# Patient Record
Sex: Male | Born: 1960 | Race: White | Hispanic: No | Smoking: Never smoker
Health system: Southern US, Community
[De-identification: ages and names within clinical notes are randomized; demographics above are authoritative.]

## PROBLEM LIST (undated history)

## (undated) ENCOUNTER — Ambulatory Visit: Admission: EM | Discharge status: Against Medical Advice | Payer: 59

## (undated) HISTORY — PX: HERNIA REPAIR: SHX51

---

## 2010-05-29 ENCOUNTER — Ambulatory Visit: Payer: Self-pay | Admitting: Surgery

## 2011-04-02 ENCOUNTER — Ambulatory Visit: Payer: Self-pay | Admitting: Urology

## 2011-04-11 ENCOUNTER — Ambulatory Visit: Payer: Self-pay | Admitting: Urology

## 2013-10-01 IMAGING — CR DG IVP HYPERTENSIVE
1 series · 8 of 10 positions shown · non-contrast
Comparison: none

REASON FOR EXAM: UTI hematuria
COMMENTS:

[Series 1: scout · 0.17mm/px · 8 of 11 slices shown]
[im 1/11]
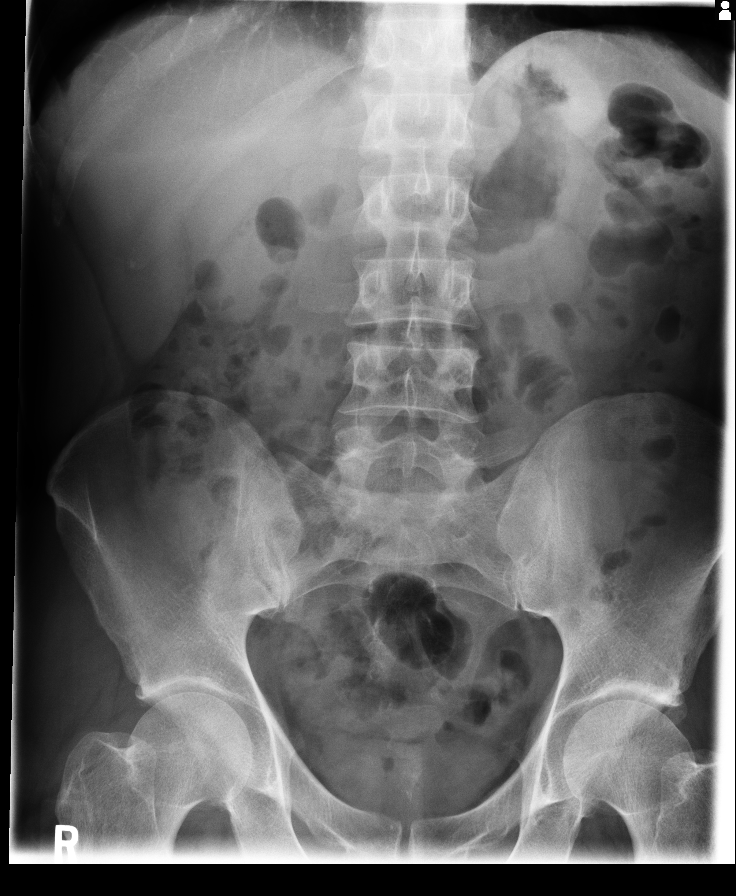
[im 2/11]
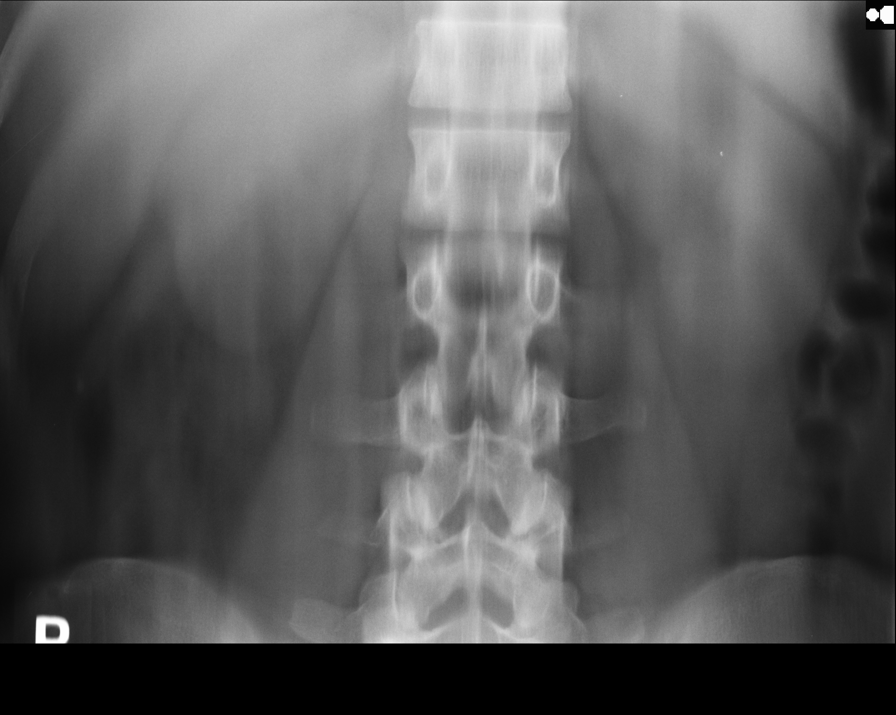
[im 3/11]
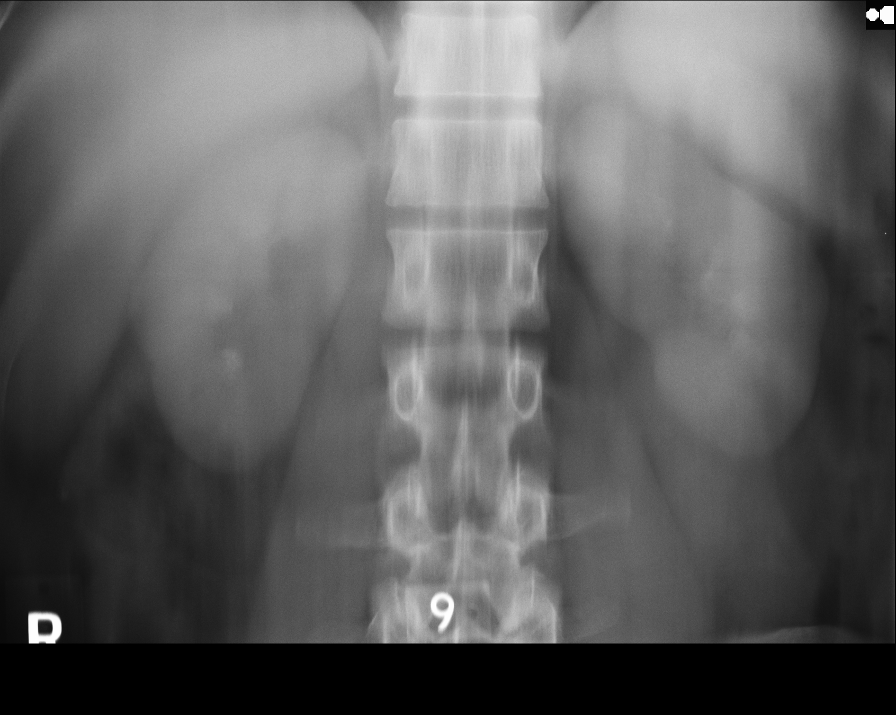
[im 4/11]
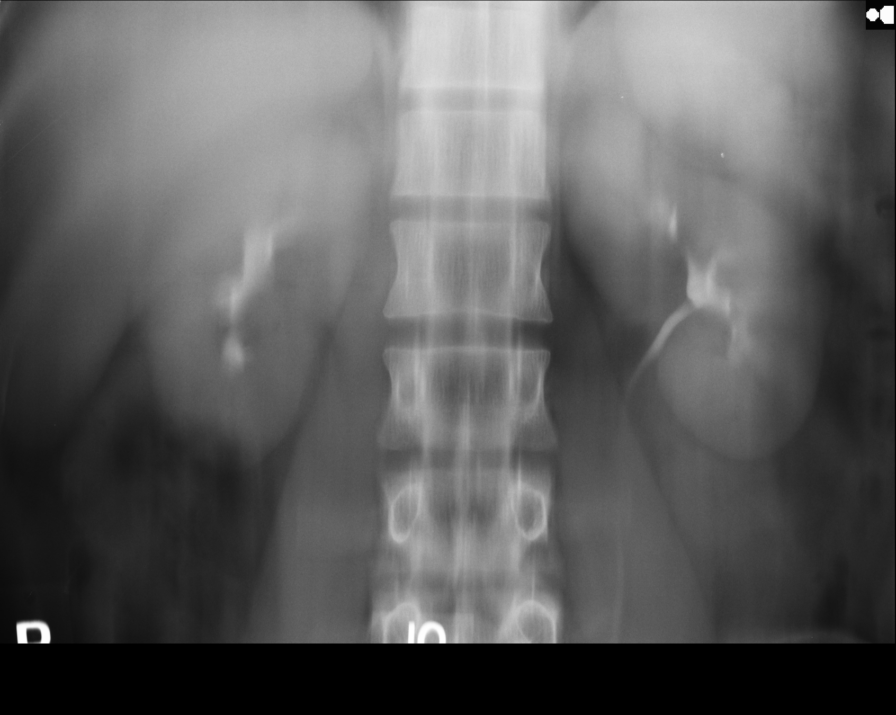
[im 5/11]
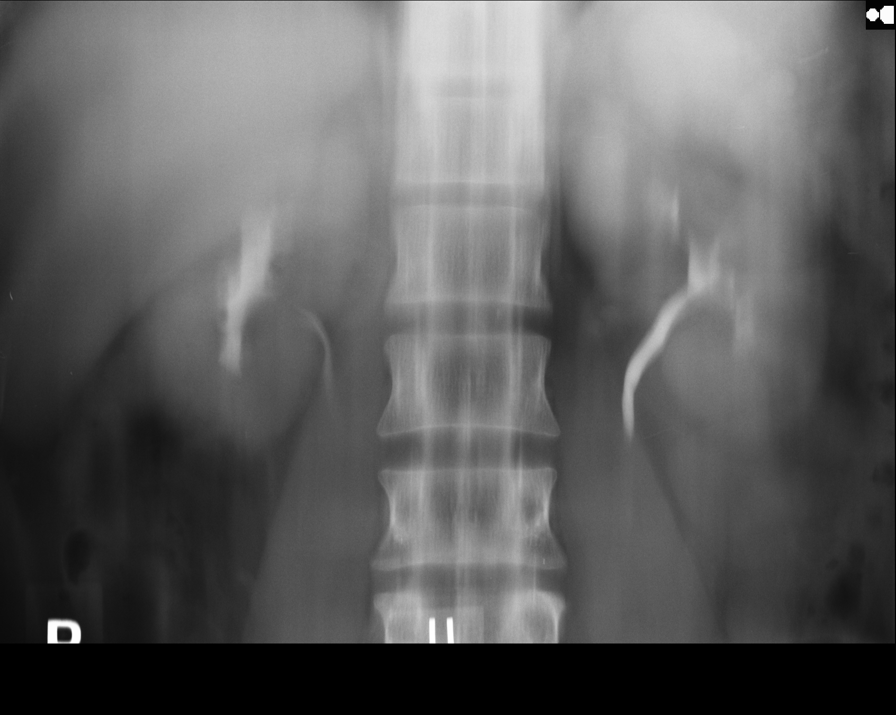
[im 6/11]
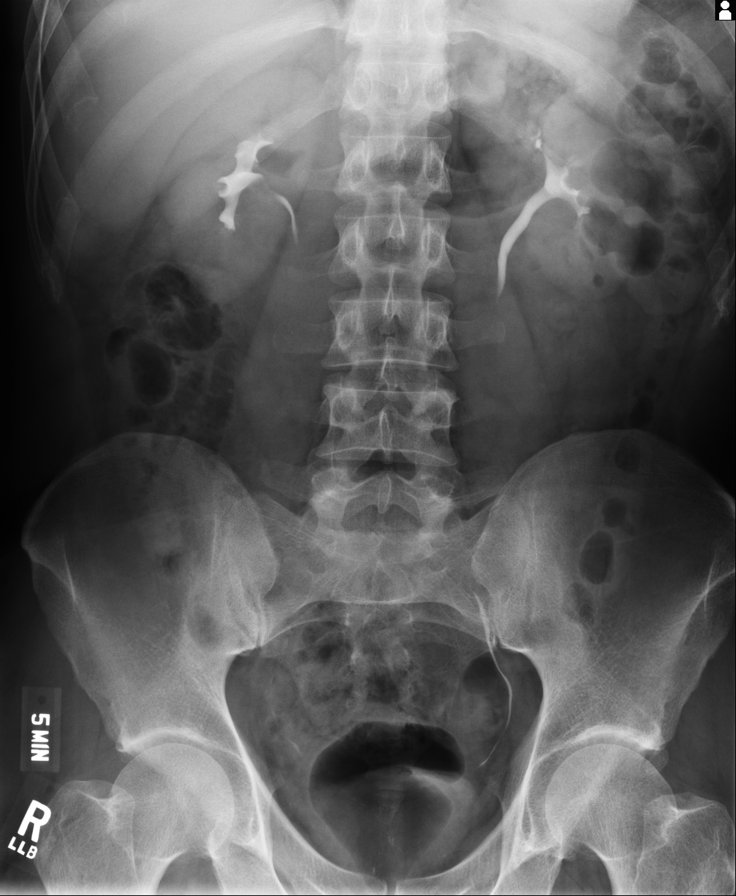
[im 7/11]
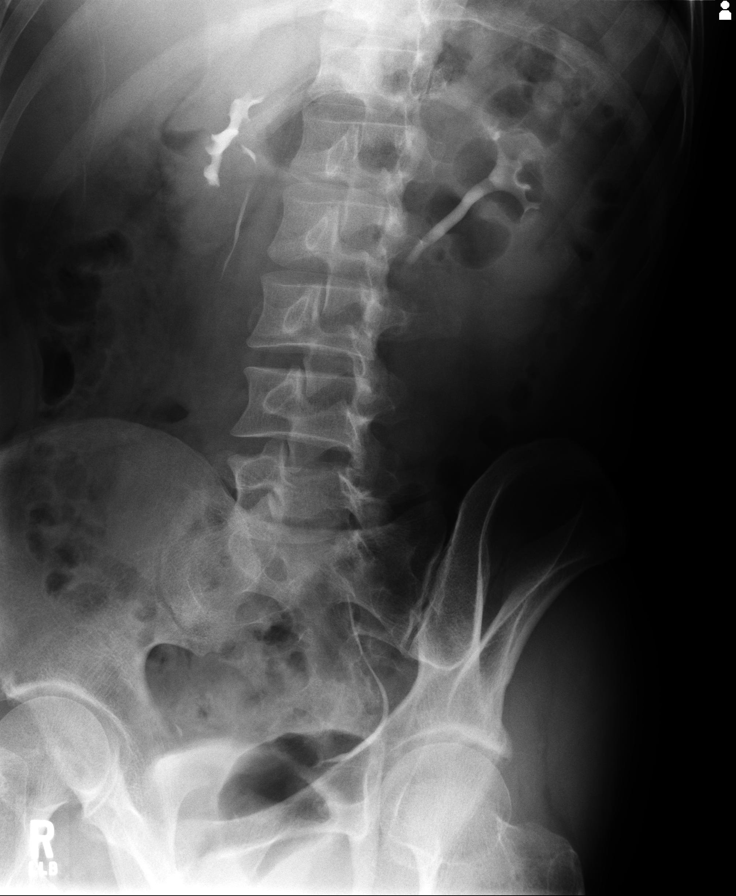
[im 8/11]
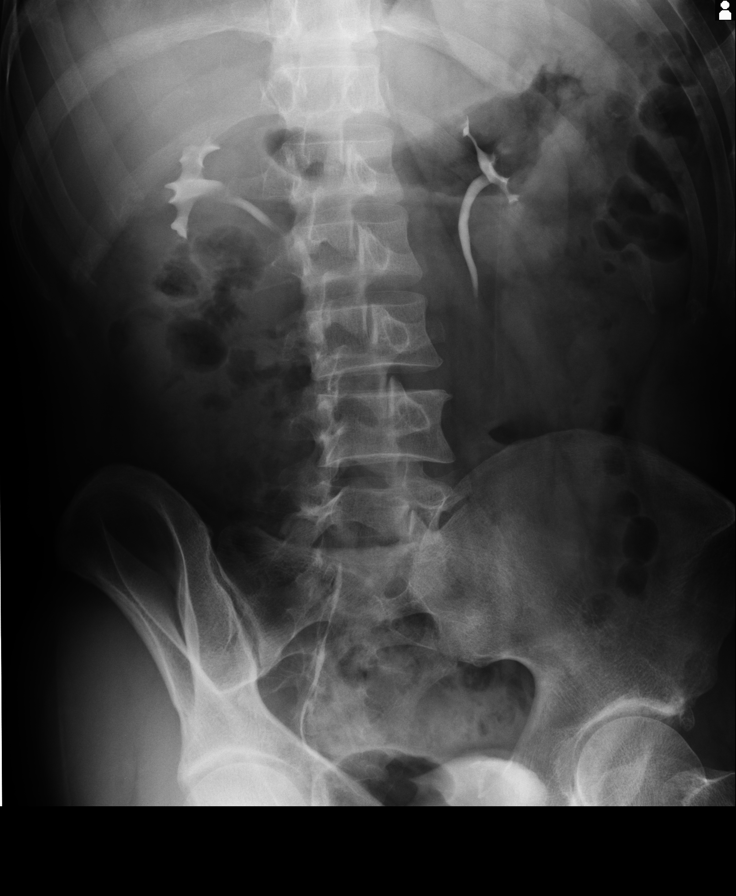

[8 of 10 positions shown; findings below may reference images not displayed]

PROCEDURE:     DXR - DXR INTRAVENOUS UROGRAPHY (IVP)  - April 11, 2011  [DATE]

RESULT:     Scout film is nonspecific. Following administration of contrast,
there is prompt bilateral renal function. The size, shape and position of
both kidneys are normal. The ureters are normal. The bladder is normal with
normal postvoid residual.
IMPRESSION: Normal exam.

## 2015-01-13 ENCOUNTER — Encounter: Payer: Self-pay | Admitting: *Deleted

## 2015-01-16 ENCOUNTER — Ambulatory Visit: Admission: RE | Admit: 2015-01-16 | Payer: 59 | Source: Ambulatory Visit | Admitting: Unknown Physician Specialty

## 2015-01-16 ENCOUNTER — Encounter: Admission: RE | Payer: Self-pay | Source: Ambulatory Visit

## 2015-01-16 SURGERY — COLONOSCOPY WITH PROPOFOL
Anesthesia: General

## 2015-02-13 ENCOUNTER — Ambulatory Visit
Admission: RE | Admit: 2015-02-13 | Discharge: 2015-02-13 | Disposition: A | Discharge status: Home / Self Care | Payer: 59 | Source: Ambulatory Visit | Attending: Unknown Physician Specialty | Admitting: Unknown Physician Specialty

## 2015-02-13 ENCOUNTER — Ambulatory Visit: Payer: 59 | Admitting: Anesthesiology

## 2015-02-13 ENCOUNTER — Encounter
Admission: RE | Disposition: A | Discharge status: Home / Self Care | Payer: Self-pay | Source: Ambulatory Visit | Attending: Unknown Physician Specialty

## 2015-02-13 DIAGNOSIS — Z9889 Other specified postprocedural states: Secondary | ICD-10-CM | POA: Diagnosis not present

## 2015-02-13 DIAGNOSIS — Z8371 Family history of colonic polyps: Secondary | ICD-10-CM | POA: Diagnosis not present

## 2015-02-13 DIAGNOSIS — K640 First degree hemorrhoids: Secondary | ICD-10-CM | POA: Insufficient documentation

## 2015-02-13 DIAGNOSIS — Z1211 Encounter for screening for malignant neoplasm of colon: Secondary | ICD-10-CM | POA: Insufficient documentation

## 2015-02-13 DIAGNOSIS — K621 Rectal polyp: Secondary | ICD-10-CM | POA: Diagnosis not present

## 2015-02-13 HISTORY — PX: COLONOSCOPY WITH PROPOFOL: SHX5780

## 2015-02-13 SURGERY — COLONOSCOPY WITH PROPOFOL
Anesthesia: General

## 2015-02-13 MED ORDER — SODIUM CHLORIDE 0.9 % IV SOLN
INTRAVENOUS | Status: DC
  Administered 2015-02-13: 1000 mL via INTRAVENOUS

## 2015-02-13 MED ORDER — PROPOFOL 500 MG/50ML IV EMUL
INTRAVENOUS | Status: DC | PRN
  Administered 2015-02-13: 120 ug/kg/min via INTRAVENOUS

## 2015-02-13 MED ORDER — MIDAZOLAM HCL 5 MG/5ML IJ SOLN
INTRAMUSCULAR | Status: DC | PRN
  Administered 2015-02-13: 1 mg via INTRAVENOUS

## 2015-02-13 MED ORDER — FENTANYL CITRATE (PF) 100 MCG/2ML IJ SOLN
INTRAMUSCULAR | Status: DC | PRN
  Administered 2015-02-13: 50 ug via INTRAVENOUS

## 2015-02-13 MED ORDER — SODIUM CHLORIDE 0.9 % IV SOLN: INTRAVENOUS | Status: DC

## 2015-02-13 MED ORDER — PROPOFOL 10 MG/ML IV BOLUS
INTRAVENOUS | Status: DC | PRN
  Administered 2015-02-13: 38.6 mg via INTRAVENOUS

## 2015-02-13 NOTE — H&P (Signed)
   Primary Care Physician:  Barbette Reichmann, MD Primary Gastroenterologist:  Dr. Mechele Collin  Pre-Procedure History & Physical: HPI:  Isaiah Bell is a 55 y.o. male is here for an colonoscopy.   No past medical history on file.  Past Surgical History  Procedure Laterality Date  . Hernia repair      Prior to Admission medications   Not on File    Allergies as of 01/23/2015  . (No Known Allergies)    No family history on file.  Social History   Social History  . Marital Status: Widowed    Spouse Name: N/A  . Number of Children: N/A  . Years of Education: N/A   Occupational History  . Not on file.   Social History Main Topics  . Smoking status: Not on file  . Smokeless tobacco: Not on file  . Alcohol Use: Not on file  . Drug Use: Not on file  . Sexual Activity: Not on file   Other Topics Concern  . Not on file   Social History Narrative  . No narrative on file    Review of Systems: See HPI, otherwise negative ROS  Physical Exam: BP 131/85 mmHg  Pulse 75  Temp(Src) 97.5 F (36.4 C) (Tympanic)  Resp 16  Ht  (1.753 m)  Wt 77.111 kg (170 lb)  BMI 25.09 kg/m2  SpO2 100% General:   Alert,  pleasant and cooperative in NAD Head:  Normocephalic and atraumatic. Neck:  Supple; no masses or thyromegaly. Lungs:  Clear throughout to auscultation.    Heart:  Regular rate and rhythm. Abdomen:  Soft, nontender and nondistended. Normal bowel sounds, without guarding, and without rebound.   Neurologic:  Alert and  oriented x4;  grossly normal neurologically.  Impression/Plan: Isaiah Bell is here for an colonoscopy to be performed for screening with FH colon polyps  Risks, benefits, limitations, and alternatives regarding  colonoscopy have been reviewed with the patient.  Questions have been answered.  All parties agreeable.   Lynnae Prude, MD  02/13/2015, 9:34 AM

## 2015-02-13 NOTE — Anesthesia Preprocedure Evaluation (Addendum)
Anesthesia Evaluation  Patient identified by MRN, date of birth, ID band Patient awake    Reviewed: Allergy & Precautions, NPO status , Patient's Chart, lab work & pertinent test results  History of Anesthesia Complications Negative for: history of anesthetic complications  Airway Mallampati: II       Dental   Pulmonary neg pulmonary ROS,           Cardiovascular negative cardio ROS       Neuro/Psych negative neurological ROS     GI/Hepatic negative GI ROS, Neg liver ROS,   Endo/Other  negative endocrine ROS  Renal/GU negative Renal ROS     Musculoskeletal   Abdominal   Peds  Hematology negative hematology ROS (+)   Anesthesia Other Findings   Reproductive/Obstetrics                             Anesthesia Physical Anesthesia Plan  ASA: I  Anesthesia Plan: General   Post-op Pain Management:    Induction: Intravenous  Airway Management Planned: Nasal Cannula  Additional Equipment:   Intra-op Plan:   Post-operative Plan:   Informed Consent: I have reviewed the patients History and Physical, chart, labs and discussed the procedure including the risks, benefits and alternatives for the proposed anesthesia with the patient or authorized representative who has indicated his/her understanding and acceptance.     Plan Discussed with:   Anesthesia Plan Comments:         Anesthesia Quick Evaluation  

## 2015-02-13 NOTE — Transfer of Care (Signed)
Immediate Anesthesia Transfer of Care Note  Patient: Isaiah Bell  Procedure(s) Performed: Procedure(s): COLONOSCOPY WITH PROPOFOL (N/A)  Patient Location: PACU  Anesthesia Type:General  Level of Consciousness: sedated  Airway & Oxygen Therapy: Patient Spontanous Breathing  Post-op Assessment: Report given to RN  Post vital signs: Reviewed  Last Vitals:  Filed Vitals:   02/13/15 0859 02/13/15 1000  BP: 131/85 85/66  Pulse: 75 70  Temp: 36.4 C 36.2 C  Resp: 16 14    Complications: No apparent anesthesia complications

## 2015-02-13 NOTE — Anesthesia Postprocedure Evaluation (Signed)
Anesthesia Post Note  Patient: Isaiah Bell  Procedure(s) Performed: Procedure(s) (LRB): COLONOSCOPY WITH PROPOFOL (N/A)  Patient location during evaluation: Endoscopy Anesthesia Type: General Level of consciousness: awake and alert Pain management: pain level controlled Vital Signs Assessment: post-procedure vital signs reviewed and stable Respiratory status: spontaneous breathing and respiratory function stable Cardiovascular status: stable Anesthetic complications: no    Last Vitals:  Filed Vitals:   02/13/15 0859 02/13/15 1000  BP: 131/85 85/66  Pulse: 75 70  Temp: 36.4 C 36.2 C  Resp: 16 14    Last Pain: There were no vitals filed for this visit.               Deniz Eskridge K

## 2015-02-13 NOTE — Op Note (Signed)
[facility]astroenterology Patient Name: Isaiah Bell Procedure Date: 02/13/2015 9:26 AM MRN: [id number] Account #: [id number] Date of Birth: Aug 16, 1960 Admit Type: Outpatient Age: 55 Room: [facility] ENDO ROOM 1 Gender: Male Note Status: Finalized Procedure:         Colonoscopy Indications:       Colon cancer screening in patient at increased risk:                     Family history of 1st-degree relative with colon polyps Providers:         Scot Jun, MD Referring MD:      Barbette Reichmann, MD (Referring MD) Medicines:         Propofol per Anesthesia Complications:     No immediate complications. Procedure:         Pre-Anesthesia Assessment:                    - After reviewing the risks and benefits, the patient was                     deemed in satisfactory condition to undergo the procedure.                    After obtaining informed consent, the colonoscope was                     passed under direct vision. Throughout the procedure, the                     patient's blood pressure, pulse, and oxygen saturations                     were monitored continuously. The Colonoscope was                     introduced through the anus and advanced to the the cecum,                     identified by appendiceal orifice and ileocecal valve. The                     colonoscopy was performed without difficulty. The patient                     tolerated the procedure well. The quality of the bowel                     preparation was excellent. Findings:      A diminutive polyp was found in the rectum. The polyp was sessile. The       polyp was removed with a jumbo cold forceps. Resection and retrieval       were complete.      Internal hemorrhoids were found during endoscopy. The hemorrhoids were       small and Grade I (internal hemorrhoids that do not prolapse).      The exam was otherwise without abnormality. Impression:        - One diminutive polyp in the  rectum. Resected and                     retrieved.                    - Internal hemorrhoids.                    -  The examination was otherwise normal. Recommendation:    - Await pathology results. Scot Jun, MD 02/13/2015 9:58:03 AM This report has been signed electronically. Number of Addenda: 0 Note Initiated On: 02/13/2015 9:26 AM Scope Withdrawal Time: 0 hours 11 minutes 12 seconds  Total Procedure Duration: 0 hours 16 minutes 15 seconds       [facility]

## 2015-02-14 ENCOUNTER — Encounter: Payer: Self-pay | Admitting: Unknown Physician Specialty

## 2015-02-14 LAB — SURGICAL PATHOLOGY

## 2015-05-16 ENCOUNTER — Ambulatory Visit: Payer: 59 | Admitting: Sports Medicine

## 2016-02-08 DIAGNOSIS — R0981 Nasal congestion: Secondary | ICD-10-CM | POA: Diagnosis not present

## 2016-02-08 DIAGNOSIS — R05 Cough: Secondary | ICD-10-CM | POA: Diagnosis not present

## 2016-09-24 DIAGNOSIS — Z0000 Encounter for general adult medical examination without abnormal findings: Secondary | ICD-10-CM | POA: Diagnosis not present

## 2016-09-24 DIAGNOSIS — Z125 Encounter for screening for malignant neoplasm of prostate: Secondary | ICD-10-CM | POA: Diagnosis not present

## 2016-11-01 DIAGNOSIS — Z0000 Encounter for general adult medical examination without abnormal findings: Secondary | ICD-10-CM | POA: Diagnosis not present

## 2016-11-01 DIAGNOSIS — E7800 Pure hypercholesterolemia, unspecified: Secondary | ICD-10-CM | POA: Diagnosis not present

## 2016-11-01 DIAGNOSIS — Z125 Encounter for screening for malignant neoplasm of prostate: Secondary | ICD-10-CM | POA: Diagnosis not present

## 2017-09-04 DIAGNOSIS — E7800 Pure hypercholesterolemia, unspecified: Secondary | ICD-10-CM | POA: Diagnosis not present

## 2017-09-04 DIAGNOSIS — Z0000 Encounter for general adult medical examination without abnormal findings: Secondary | ICD-10-CM | POA: Diagnosis not present

## 2017-09-04 DIAGNOSIS — Z125 Encounter for screening for malignant neoplasm of prostate: Secondary | ICD-10-CM | POA: Diagnosis not present

## 2017-11-03 DIAGNOSIS — Z0000 Encounter for general adult medical examination without abnormal findings: Secondary | ICD-10-CM | POA: Diagnosis not present

## 2020-01-13 ENCOUNTER — Encounter: Payer: Self-pay | Admitting: Emergency Medicine

## 2020-01-13 ENCOUNTER — Other Ambulatory Visit: Payer: Self-pay

## 2020-01-13 ENCOUNTER — Ambulatory Visit
Admission: EM | Admit: 2020-01-13 | Discharge: 2020-01-13 | Disposition: A | Discharge status: Home / Self Care | Payer: Worker's Compensation

## 2020-01-13 DIAGNOSIS — S56911A Strain of unspecified muscles, fascia and tendons at forearm level, right arm, initial encounter: Secondary | ICD-10-CM | POA: Diagnosis not present

## 2020-01-13 NOTE — Discharge Instructions (Addendum)
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  Take over-the-counter ibuprofen to help decrease inflammation.  Ice your hands and forearms 2-3 times a day to decrease inflammation and help with pain relief.  Work your hands and fingers through range of motion several times a day to help keep the tendons loose.

## 2020-01-13 NOTE — ED Triage Notes (Signed)
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  Patient states that today at work he started to develop a cramp in his fingers in his right hand and as the day went on started to have pain in his right wrist that radiates up his right forearm.

## 2020-01-13 NOTE — ED Provider Notes (Signed)
[facility]    CSN: [id number] Arrival date & time: 01/13/20  1632      History   Chief Complaint Chief Complaint  Patient presents with  . Wrist Pain    right    HPI Isaiah Bell is a 60 y.o. male.   HPI   60 year old man here for evaluation of pain in his right wrist.  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  Patient also reports that later on while he was at dinner he developed some cramping in his left hand.  Patient does not have any bony tenderness.  Patient has full range of motion and sensation in both hands.  History reviewed. No pertinent past medical history.  There are no problems to display for this patient.   Past Surgical History:  Procedure Laterality Date  . COLONOSCOPY WITH PROPOFOL N/A 02/13/2015   Procedure: COLONOSCOPY WITH PROPOFOL;  Surgeon: Scot Jun, MD;  Location: [facility] ENDOSCOPY;  Service: Endoscopy;  Laterality: N/A;  . HERNIA REPAIR         Home Medications    Prior to Admission medications   Not on File    Family History History reviewed. No pertinent family history.  Social History Social History   Tobacco Use  . Smoking status: Never Smoker  . Smokeless tobacco: Never Used  Vaping Use  . Vaping Use: Never used  Substance Use Topics  . Alcohol use: Not Currently  . Drug use: Never     Allergies   Patient has no known allergies.   Review of Systems Review of Systems  Constitutional: Negative for activity change and appetite change.  Musculoskeletal: Positive for myalgias. Negative for arthralgias and joint swelling.  Skin: Negative for color change.  Neurological: Negative for weakness.   Hematological: Negative.   Psychiatric/Behavioral: Negative.      Physical Exam Triage Vital Signs ED Triage Vitals  Enc Vitals Group     BP 01/13/20 1712 (!) 159/102     Pulse Rate 01/13/20 1712 85     Resp 01/13/20 1712 16     Temp 01/13/20 1712 98.2 F (36.8 C)     Temp Source 01/13/20 1712 Oral     SpO2 01/13/20 1712 98 %     Weight 01/13/20 1709 165 lb (74.8 kg)     Height 01/13/20 1709 5\' 9"  (1.753 m)     Head Circumference --      Peak Flow --      Pain Score 01/13/20 1709 9     Pain Loc --      Pain Edu? --      Excl. in GC? --    No data found.  Updated Vital Signs BP (!) 159/102 (BP Location: Left Arm)   Pulse 85   Temp 98.2 F (36.8 C) (Oral)   Resp 16   Ht 5\' 9"  (1.753 m)   Wt 165 lb (74.8 kg)   SpO2 98%   BMI 24.37 kg/m   Visual Acuity Right Eye Distance:   Left Eye Distance:   Bilateral Distance:    Right Eye Near:   Left Eye Near:  Bilateral Near:     Physical Exam Vitals and nursing note reviewed.  Constitutional:      General: He is not in acute distress.    Appearance: Normal appearance. He is normal weight. He is not toxic-appearing.  HENT:     Head: Normocephalic and atraumatic.  Musculoskeletal:        General: Tenderness present. No swelling or deformity. Normal range of motion.  Skin:    General: Skin is warm and dry.     Capillary Refill: Capillary refill takes less than 2 seconds.     Findings: No erythema.  Neurological:     General: No focal deficit present.     Mental Status: He is alert and oriented to person, place, and time.  Psychiatric:        Mood and Affect: Mood normal.        Behavior: Behavior normal.        Thought Content: Thought content normal.        Judgment: Judgment normal.      UC Treatments / Results  Labs (all labs ordered are listed, but only abnormal results are displayed) Labs Reviewed - No data to display  EKG   Radiology No results found.  Procedures Procedures (including  critical care time)  Medications Ordered in UC Medications - No data to display  Initial Impression / Assessment and Plan / UC Course  I have reviewed the triage vital signs and the nursing notes.  Pertinent labs & imaging results that were available during my care of the patient were reviewed by me and considered in my medical decision making (see chart for details).   Patient is here for evaluation of cramps in his fingers of his right hand and pain in the wrists and forearms of his right forearm after a day of pulling taffy.  Patient has no bony tenderness, numbness, tingling, weakness, or decreased range of motion the fingers of his right hand.  Capillary refill is less than 2 seconds.  Patient does have tenderness to the flexor digitorum superficialis of the right forearm.  Patient's exam is consistent with forearm strain.  Will treat with over-the-counter ibuprofen, rest, and icing of the hands and forearms.   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  Take over-the-counter ibuprofen to help decrease inflammation.  Ice your hands and forearms 2-3 times a day to decrease inflammation and help with pain relief.  Work your hands and fingers through range of motion several times a day to help keep the tendons loose.    ED Prescriptions    None     PDMP not reviewed this encounter.   Becky Augusta, NP 01/13/20 1747

## 2021-04-09 ENCOUNTER — Ambulatory Visit: Payer: 59 | Admitting: Podiatry

## 2021-04-10 ENCOUNTER — Ambulatory Visit: Payer: 59 | Admitting: Podiatry

## 2021-04-10 DIAGNOSIS — L600 Ingrowing nail: Secondary | ICD-10-CM | POA: Diagnosis not present

## 2021-04-10 MED ORDER — GENTAMICIN SULFATE 0.1 % EX CREA: 1.0000 "application " | TOPICAL_CREAM | Freq: Two times a day (BID) | CUTANEOUS | 1 refills | Status: AC

## 2021-04-10 NOTE — Patient Instructions (Signed)

## 2021-04-10 NOTE — Progress Notes (Signed)
? ?  Subjective: ?Patient presents today for evaluation of pain to the medial and lateral border right great toe. Patient is concerned for possible ingrown nail.  It is very sensitive to touch.  Patient presents today for further treatment and evaluation. ? ?No past medical history on file. ? ?Objective:  ?General: Well developed, nourished, in no acute distress, alert and oriented x3  ? ?Dermatology: Skin is warm, dry and supple bilateral.  Medial and lateral border right great toe appears to be erythematous with evidence of an ingrowing nail. Pain on palpation noted to the border of the nail fold. The remaining nails appear unremarkable at this time. There are no open sores, lesions. ? ?Vascular: Dorsalis Pedis artery and Posterior Tibial artery pedal pulses palpable. No lower extremity edema noted.  ? ?Neruologic: Grossly intact via light touch bilateral. ? ?Musculoskeletal: Muscular strength within normal limits in all groups bilateral. Normal range of motion noted to all pedal and ankle joints.  ? ?Assesement: ?#1 Paronychia with ingrowing nail medial and lateral border right great toe ?#2 Pain in toe ? ?Plan of Care:  ?1. Patient evaluated.  ?2. Discussed treatment alternatives and plan of care. Explained nail avulsion procedure and post procedure course to patient. ?3. Patient opted for permanent partial nail avulsion of the ingrown portion of the nail.  ?4. Prior to procedure, local anesthesia infiltration utilized using 3 ml of a 50:50 mixture of 2% plain lidocaine and 0.5% plain marcaine in a normal hallux block fashion and a betadine prep performed.  ?5. Partial permanent nail avulsion with chemical matrixectomy performed using 3x30sec applications of phenol followed by alcohol flush.  ?6. Light dressing applied.  Post care instructions provided ?7.  Prescription for gentamicin 2% cream  ?8.  Return to clinic 2 weeks. ? ?*Works at the [employer].  Wife's name is Joyce Gross. ? ?Felecia Shelling, DPM ?[facility] ? ?Dr. Felecia Shelling, DPM  ?  ?[address]                                       ?[place], [place] [address]                ?Office [phone]  ?Fax [phone] ? ? ? ? ?

## 2021-04-23 ENCOUNTER — Telehealth: Payer: Self-pay | Admitting: *Deleted

## 2021-04-23 NOTE — Telephone Encounter (Signed)
"  He had a ingrown toenail procedure.  We just want to confirm that it's part of the course for the area around the nail bed being still red.  He experiences pain in it off and on.  If it isn't, we want to be seen prior to his next appointment." ?

## 2021-04-23 NOTE — Telephone Encounter (Signed)
Please notify patient that it is normal to have some redness around the toenail procedure area due to the phenol.  Continue soaking and applying the antibiotic cream until next follow-up appointment.  Thanks, Dr. Logan Bores

## 2021-04-24 NOTE — Telephone Encounter (Signed)
I left him a voicemail message of Dr. Amalia Hailey' response.  ? ?"Please notify patient that it is normal to have some redness around the toenail procedure area due to the phenol. Continue soaking and applying the antibiotic cream until next follow-up appointment." ?

## 2021-05-02 ENCOUNTER — Ambulatory Visit (INDEPENDENT_AMBULATORY_CARE_PROVIDER_SITE_OTHER): Payer: 59 | Admitting: Podiatry

## 2021-05-02 DIAGNOSIS — L600 Ingrowing nail: Secondary | ICD-10-CM | POA: Diagnosis not present

## 2021-05-06 NOTE — Progress Notes (Signed)
?  Subjective:  ?Patient ID: Isaiah Bell, male    DOB: [birth year],  MRN: [id number] ? ?Chief Complaint  ?Patient presents with  ? Ingrown Toenail  ?  Nail check  ? ? ?61 y.o. male presents with the above complaint. History confirmed with patient.  Doing well feels like the nail is healing okay ? ?Objective:  ?Physical Exam: ?warm, good capillary refill, no trophic changes or ulcerative lesions, normal DP and PT pulses, normal sensory exam, and right great toe matricectomy site is healing well. ? ? ?Assessment:  ? ?1. Ingrown toenail of right foot   ? ? ? ?Plan:  ?Patient was evaluated and treated and all questions answered. ? ?Doing well can leave open to air and discontinue soaks and ointment at this point.  Follow-up as needed. ? ?Return if symptoms worsen or fail to improve.  ? ?
